# Patient Record
Sex: Female | Born: 2000 | Race: Black or African American | Hispanic: No | Marital: Married | Smoking: Never smoker
Health system: Southern US, Community
[De-identification: ages and names within clinical notes are randomized; demographics above are authoritative.]

## PROBLEM LIST (undated history)

## (undated) DIAGNOSIS — M419 Scoliosis, unspecified: Secondary | ICD-10-CM

## (undated) DIAGNOSIS — J45909 Unspecified asthma, uncomplicated: Secondary | ICD-10-CM

## (undated) HISTORY — PX: HERNIA REPAIR: SHX51

---
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
## 2022-11-01 DIAGNOSIS — Z0000 Encounter for general adult medical examination without abnormal findings: Secondary | ICD-10-CM | POA: Insufficient documentation

## 2022-11-01 DIAGNOSIS — Z8739 Personal history of other diseases of the musculoskeletal system and connective tissue: Secondary | ICD-10-CM | POA: Insufficient documentation

## 2022-11-01 DIAGNOSIS — J45909 Unspecified asthma, uncomplicated: Secondary | ICD-10-CM | POA: Insufficient documentation

## 2022-11-01 DIAGNOSIS — Z23 Encounter for immunization: Secondary | ICD-10-CM | POA: Insufficient documentation

## 2022-11-01 DIAGNOSIS — Z6827 Body mass index (BMI) 27.0-27.9, adult: Secondary | ICD-10-CM | POA: Insufficient documentation

## 2024-03-05 ENCOUNTER — Other Ambulatory Visit: Payer: Self-pay | Admitting: Medical Genetics

## 2024-03-12 NOTE — Patient Instructions (Signed)
 Preventive Care 25-25 Years Old, Female Preventive care refers to lifestyle choices and visits with your health care provider that can promote health and wellness. Preventive care visits are also called wellness exams. What can I expect for my preventive care visit? Counseling During your preventive care visit, your health care provider may ask about your: Medical history, including: Past medical problems. Family medical history. Pregnancy history. Current health, including: Menstrual cycle. Method of birth control. Emotional well-being. Home life and relationship well-being. Sexual activity and sexual health. Lifestyle, including: Alcohol, nicotine or tobacco, and drug use. Access to firearms. Diet, exercise, and sleep habits. Work and work Astronomer. Sunscreen use. Safety issues such as seatbelt and bike helmet use. Physical exam Your health care provider may check your: Height and weight. These may be used to calculate your BMI (body mass index). BMI is a measurement that tells if you are at a healthy weight. Waist circumference. This measures the distance around your waistline. This measurement also tells if you are at a healthy weight and may help predict your risk of certain diseases, such as type 2 diabetes and high blood pressure. Heart rate and blood pressure. Body temperature. Skin for abnormal spots. What immunizations do I need?  Vaccines are usually given at various ages, according to a schedule. Your health care provider will recommend vaccines for you based on your age, medical history, and lifestyle or other factors, such as travel or where you work. What tests do I need? Screening Your health care provider may recommend screening tests for certain conditions. This may include: Pelvic exam and Pap test. Lipid and cholesterol levels. Diabetes screening. This is done by checking your blood sugar (glucose) after you have not eaten for a while (fasting). Hepatitis  B test. Hepatitis C test. HIV (human immunodeficiency virus) test. STI (sexually transmitted infection) testing, if you are at risk. BRCA-related cancer screening. This may be done if you have a family history of breast, ovarian, tubal, or peritoneal cancers. Talk with your health care provider about your test results, treatment options, and if necessary, the need for more tests. Follow these instructions at home: Eating and drinking  Eat a healthy diet that includes fresh fruits and vegetables, whole grains, lean protein, and low-fat dairy products. Take vitamin and mineral supplements as recommended by your health care provider. Do not drink alcohol if: Your health care provider tells you not to drink. You are pregnant, may be pregnant, or are planning to become pregnant. If you drink alcohol: Limit how much you have to 0-1 drink a day. Know how much alcohol is in your drink. In the [place] one drink equals one 12 oz bottle of beer (355 mL), one 5 oz glass of wine (148 mL), or one 1 oz glass of hard liquor (44 mL). Lifestyle Brush your teeth every morning and night with fluoride toothpaste. Floss one time each day. Exercise for at least 30 minutes 5 or more days each week. Do not use any products that contain nicotine or tobacco. These products include cigarettes, chewing tobacco, and vaping devices, such as e-cigarettes. If you need help quitting, ask your health care provider. Do not use drugs. If you are sexually active, practice safe sex. Use a condom or other form of protection to prevent STIs. If you do not wish to become pregnant, use a form of birth control. If you plan to become pregnant, see your health care provider for a prepregnancy visit. Find healthy ways to manage stress, such as: Meditation,  yoga, or listening to music. Journaling. Talking to a trusted person. Spending time with friends and family. Minimize exposure to UV radiation to reduce your risk of skin  cancer. Safety Always wear your seat belt while driving or riding in a vehicle. Do not drive: If you have been drinking alcohol. Do not ride with someone who has been drinking. If you have been using any mind-altering substances or drugs. While texting. When you are tired or distracted. Wear a helmet and other protective equipment during sports activities. If you have firearms in your house, make sure you follow all gun safety procedures. Seek help if you have been physically or sexually abused. What's next? Go to your health care provider once a year for an annual wellness visit. Ask your health care provider how often you should have your eyes and teeth checked. Stay up to date on all vaccines. This information is not intended to replace advice given to you by your health care provider. Make sure you discuss any questions you have with your health care provider. Document Revised: 04/05/2021 Document Reviewed: 04/05/2021 Elsevier Patient Education  2024 [employer] Breast Self-Awareness Breast self-awareness is knowing how your breasts look and feel. You need to: Check your breasts on a regular basis. Tell your doctor about any changes. Become familiar with the look and feel of your breasts. This can help you catch a breast problem while it is still small and can be treated. You should do breast self-exams even if you have breast implants. What you need: A mirror. A well-lit room. A pillow or other soft object. How to do a breast self-exam Follow these steps to do a breast self-exam: Look for changes  Take off all the clothes above your waist. Stand in front of a mirror in a room with good lighting. Put your hands down at your sides. Compare your breasts in the mirror. Look for any difference between them, such as: A difference in shape. A difference in size. Wrinkles, dips, and bumps in one breast and not the other. Look at each breast for changes in the skin, such  as: Redness. Scaly areas. Skin that has gotten thicker. Dimpling. Open sores (ulcers). Look for changes in your nipples, such as: Fluid coming out of a nipple. Fluid around a nipple. Bleeding. Dimpling. Redness. A nipple that looks pushed in (retracted), or that has changed position. Feel for changes Lie on your back. Feel each breast. To do this: Pick a breast to feel. Place a pillow under the shoulder closest to that breast. Put the arm closest to that breast behind your head. Feel the nipple area of that breast using the hand of your other arm. Feel the area with the pads of your three middle fingers by making small circles with your fingers. Use light, medium, and firm pressure. Continue the overlapping circles, moving downward over the breast. Keep making circles with your fingers. Stop when you feel your ribs. Start making circles with your fingers again, this time going upward until you reach your collarbone. Then, make circles outward across your breast and into your armpit area. Squeeze your nipple. Check for discharge and lumps. Repeat these steps to check your other breast. Sit or stand in the tub or shower. With soapy water on your skin, feel each breast the same way you did when you were lying down. Write down what you find Writing down what you find can help you remember what to tell your doctor. Write down: What is  normal for each breast. Any changes you find in each breast. These include: The kind of changes you find. A tender or painful breast. Any lump you find. Write down its size and where it is. When you last had your monthly period (menstrual cycle). General tips If you are breastfeeding, the best time to check your breasts is after you feed your baby or after you use a breast pump. If you get monthly bleeding, the best time to check your breasts is 5-7 days after your monthly cycle ends. With time, you will become comfortable with the self-exam. You will  also start to know if there are changes in your breasts. Contact a doctor if: You see a change in the shape or size of your breasts or nipples. You see a change in the skin of your breast or nipples, such as red or scaly skin. You have fluid coming from your nipples that is not normal. You find a new lump or thick area. You have breast pain. You have any concerns about your breast health. Summary Breast self-awareness includes looking for changes in your breasts and feeling for changes within your breasts. You should do breast self-awareness in front of a mirror in a well-lit room. If you get monthly periods (menstrual cycles), the best time to check your breasts is 5-7 days after your period ends. Tell your doctor about any changes you see in your breasts. Changes include changes in size, changes on the skin, painful or tender breasts, or fluid from your nipples that is not normal. This information is not intended to replace advice given to you by your health care provider. Make sure you discuss any questions you have with your health care provider. Document Revised: 03/15/2022 Document Reviewed: 08/10/2021 Elsevier Patient Education  2024 [employer].

## 2024-03-13 ENCOUNTER — Encounter: Payer: Self-pay | Admitting: Certified Nurse Midwife

## 2024-03-13 ENCOUNTER — Other Ambulatory Visit: Payer: Self-pay

## 2024-03-13 ENCOUNTER — Ambulatory Visit: Payer: Self-pay | Admitting: Certified Nurse Midwife

## 2024-03-13 VITALS — BP 120/80 | HR 74 | Resp 16 | Ht 65.5 in | Wt 174.9 lb

## 2024-03-13 DIAGNOSIS — Z131 Encounter for screening for diabetes mellitus: Secondary | ICD-10-CM

## 2024-03-13 DIAGNOSIS — E282 Polycystic ovarian syndrome: Secondary | ICD-10-CM | POA: Insufficient documentation

## 2024-03-13 DIAGNOSIS — N83201 Unspecified ovarian cyst, right side: Secondary | ICD-10-CM

## 2024-03-13 DIAGNOSIS — Z1159 Encounter for screening for other viral diseases: Secondary | ICD-10-CM | POA: Diagnosis not present

## 2024-03-13 DIAGNOSIS — Z01419 Encounter for gynecological examination (general) (routine) without abnormal findings: Secondary | ICD-10-CM | POA: Insufficient documentation

## 2024-03-13 DIAGNOSIS — Z113 Encounter for screening for infections with a predominantly sexual mode of transmission: Secondary | ICD-10-CM

## 2024-03-13 DIAGNOSIS — Z114 Encounter for screening for human immunodeficiency virus [HIV]: Secondary | ICD-10-CM | POA: Diagnosis not present

## 2024-03-13 MED ORDER — METFORMIN HCL 500 MG PO TABS
ORAL_TABLET | ORAL | 5 refills | Status: DC
  Filled 2024-03-13: qty 60, 24d supply, fill #0
  Filled 2024-04-09: qty 60, 4d supply, fill #1

## 2024-03-13 NOTE — Progress Notes (Signed)
 GYNECOLOGY ANNUAL PHYSICAL EXAM PROGRESS NOTE  Subjective:    Jessica Gillespie is a 23 y.o. G0P0000 female who presents for an annual exam.  The patient is not sexually active. The patient participates in regular exercise: yes. Has the patient ever been transfused or tattooed?: no. The patient reports that there is not domestic violence in her life.   The patient has the following complaints today: Desires follow up on PCOS. Having issues with weight gain despite daily exercise and vegetarian diet. Previously diagnosed through imaging and symptoms.  Contraception counseling - desires replacement of Paragard IUD at next visit.   Menstrual History: Menarche age: 71 Patient's last menstrual period was 03/08/2024 (exact date). Period Cycle (Days): 33 Period Duration (Days): 5 Period Pattern: Regular Menstrual Flow: Light, Moderate Menstrual Control: Other (Comment) (cup or underwear) Menstrual Control Change Freq (Hours): 12 Dysmenorrhea: None   Gynecologic History:  Contraception: condoms History of STI's: Denies Last Pap: 11/01/2022. Results were: normal.  Denies h/o abnormal pap smears.   Upstream - 03/13/24 1445       Pregnancy Intention Screening   Does the patient want to become pregnant in the next year? No    Does the patient's partner want to become pregnant in the next year? No    Would the patient like to discuss contraceptive options today? Yes      Contraception Wrap Up   Current Method No Contraceptive Precautions    Contraception Counseling Provided Yes               OB History  Gravida Para Term Preterm AB Living  0 0 0 0 0 0  SAB IAB Ectopic Multiple Live Births  0 0 0 0 0    Past Medical History:  Diagnosis Date   Asthma    Scoliosis     Past Surgical History:  Procedure Laterality Date   HERNIA REPAIR     SPINAL FUSION  2015    Family History  Problem Relation Age of Onset   Hyperlipidemia Mother    Arrhythmia Mother    Obesity  Mother    Hypothyroidism Father     Social History   Socioeconomic History   Marital status: Married    Spouse name: Deanelle   Number of children: 0   Years of education: Not on file   Highest education level: Not on file  Occupational History   Not on file  Tobacco Use   Smoking status: Never   Smokeless tobacco: Never  Vaping Use   Vaping status: Never Used  Substance and Sexual Activity   Alcohol use: No   Drug use: No   Sexual activity: Not Currently  Other Topics Concern   Not on file  Social History Narrative   Not on file   Social Drivers of Health   Financial Resource Strain: Low Risk  (11/21/2023)   Received from [facility]   Overall Financial Resource Strain (CARDIA)    Difficulty of Paying Living Expenses: Not very hard  Food Insecurity: No Food Insecurity (11/21/2023)   Received from [facility]   Hunger Vital Sign    Worried About Running Out of Food in the Last Year: Never true    Ran Out of Food in the Last Year: Never true  Transportation Needs: No Transportation Needs (11/21/2023)   Received from [facility] - Transportation    In the past 12 months, has lack of transportation  kept you from medical appointments or from getting medications?: No    Lack of Transportation (Non-Medical): No  Physical Activity: Not on file  Stress: Not on file  Social Connections: Not on file  Intimate Partner Violence: Not At Risk (06/08/2022)   Received from [facility]    Threatened: Not on file    Insulted: Not on file    Physically Hurt : Not on file    Scream: Not on file    Current Outpatient Medications on File Prior to Visit  Medication Sig Dispense Refill   albuterol  (PROVENTIL  HFA;VENTOLIN  HFA) 108 (90 BASE) MCG/ACT inhaler Inhale 2 puffs into the lungs every 6 (six) hours as needed. For coughing     No current facility-administered medications on file prior to visit.     Allergies  Allergen Reactions   Other Other (See Comments)    Pt says she is allergic to an IV anesthetic but unaware of the name.  Reaction: combative and violent.  Pt reports it is versed.   Penicillins Hives   Rocephin [Ceftriaxone]      Review of Systems Constitutional: negative for chills, fatigue, fevers and sweats Eyes: negative for irritation, redness and visual disturbance Ears, nose, mouth, throat, and face: negative for hearing loss, nasal congestion, snoring and tinnitus Respiratory: negative for asthma, cough, sputum Cardiovascular: negative for chest pain, dyspnea, exertional chest pressure/discomfort, irregular heart beat, palpitations and syncope Gastrointestinal: negative for abdominal pain, change in bowel habits, nausea and vomiting Genitourinary: negative for abnormal menstrual periods, genital lesions, sexual problems and vaginal discharge, dysuria and urinary incontinence Integument/breast: negative for breast lump, breast tenderness and nipple discharge Hematologic/lymphatic: negative for bleeding and easy bruising Musculoskeletal:negative for back pain and muscle weakness Neurological: negative for dizziness, headaches, vertigo and weakness Endocrine: negative for diabetic symptoms including polydipsia, polyuria and skin dryness Allergic/Immunologic: negative for hay fever and urticaria      Objective:  Blood pressure 120/80, pulse 74, resp. rate 16, height 5' 5.5" (1.664 m), weight 174 lb 14.4 oz (79.3 kg), last menstrual period 03/08/2024. Body mass index is 28.66 kg/m.    General Appearance:    Alert, cooperative, no distress, appears stated age  Head:    Normocephalic, without obvious abnormality, atraumatic  Eyes:    PERRL, conjunctiva/corneas clear, EOM's intact, both eyes  Ears:    Normal external ear canals, both ears  Nose:   Nares normal, septum midline, mucosa normal, no drainage or sinus tenderness  Throat:   Lips, mucosa, and tongue  normal; teeth and gums normal  Neck:   Supple, symmetrical, trachea midline, no adenopathy; thyroid: no enlargement/tenderness/nodules; no carotid bruit or JVD  Back:     Symmetric, no curvature, ROM normal, no CVA tenderness  Lungs:     Clear to auscultation bilaterally, respirations unlabored  Chest Wall:    No tenderness or deformity   Heart:    Regular rate and rhythm, S1 and S2 normal, no murmur, rub or gallop  Breast Exam:    No tenderness, masses, or nipple abnormality  Abdomen:     Soft, non-tender, bowel sounds active all four quadrants, no masses, no organomegaly.    Genitalia:    Pelvic:external genitalia normal, vagina without lesions, discharge, or tenderness, rectovaginal septum  normal. Cervix normal in appearance, no cervical motion tenderness, no adnexal masses or tenderness.  Uterus normal size, shape, mobile, regular contours, nontender.  Rectal:    Normal external sphincter.  No hemorrhoids appreciated. Internal exam not done.  Extremities:   Extremities normal, atraumatic, no cyanosis or edema  Pulses:   2+ and symmetric all extremities  Skin:   Skin color, texture, turgor normal, no rashes or lesions  Lymph nodes:   Cervical, supraclavicular, and axillary nodes normal  Neurologic:   CNII-XII intact, normal strength, sensation and reflexes throughout   .  Assessment:   1. Screen for STD (sexually transmitted disease)   2. Encounter for screening for HIV   3. Need for hepatitis C screening test   4. Encounter for well woman exam with routine gynecological exam      Plan:  Blood tests: Basic metabolic panel, Comprehensive metabolic panel, Total cholesterol, and TSH. Contraception: condoms. Desires Paragard IUD Discussed healthy lifestyle modifications. Mammogram : Not age appropriate Pap smear discussed. Not due for 2 years. Last one normal Follow up in 1 year for annual exam  Will trial metformin for insulin resistance symptoms of PCOS. Pt desires US  to assess  ovaries for history of ovarian cyst rupture and PCOS.   Donato Fu, CNM [facility]

## 2024-03-14 LAB — COMPREHENSIVE METABOLIC PANEL WITH GFR
ALT: 11 IU/L (ref 0–32)
AST: 16 IU/L (ref 0–40)
Albumin: 4.6 g/dL (ref 4.0–5.0)
Alkaline Phosphatase: 68 IU/L (ref 44–121)
BUN/Creatinine Ratio: 7 — ABNORMAL LOW (ref 9–23)
BUN: 7 mg/dL (ref 6–20)
Bilirubin Total: <0.2 mg/dL (ref 0.0–1.2)
CO2: 23 mmol/L (ref 20–29)
Calcium: 9.6 mg/dL (ref 8.7–10.2)
Chloride: 104 mmol/L (ref 96–106)
Creatinine, Ser: 0.96 mg/dL (ref 0.57–1.00)
Globulin, Total: 2.9 g/dL (ref 1.5–4.5)
Glucose: 105 mg/dL — ABNORMAL HIGH (ref 70–99)
Potassium: 3.9 mmol/L (ref 3.5–5.2)
Sodium: 143 mmol/L (ref 134–144)
Total Protein: 7.5 g/dL (ref 6.0–8.5)
eGFR: 85 mL/min/{1.73_m2} (ref >59)

## 2024-03-14 LAB — CBC
Hematocrit: 41.6 % (ref 34.0–46.6)
Hemoglobin: 13.6 g/dL (ref 11.1–15.9)
MCH: 29.7 pg (ref 26.6–33.0)
MCHC: 32.7 g/dL (ref 31.5–35.7)
MCV: 91 fL (ref 79–97)
Platelets: 318 10*3/uL (ref 150–450)
RBC: 4.58 x10E6/uL (ref 3.77–5.28)
RDW: 12.2 % (ref 11.7–15.4)
WBC: 5.5 10*3/uL (ref 3.4–10.8)

## 2024-03-14 LAB — HEMOGLOBIN A1C
Est. average glucose Bld gHb Est-mCnc: 105 mg/dL
Hgb A1c MFr Bld: 5.3 % (ref 4.8–5.6)

## 2024-03-14 LAB — TSH: TSH: 0.841 u[IU]/mL (ref 0.450–4.500)

## 2024-03-14 LAB — HIV ANTIBODY (ROUTINE TESTING W REFLEX): HIV Screen 4th Generation wRfx: NONREACTIVE

## 2024-03-14 LAB — HEPATITIS C ANTIBODY: Hep C Virus Ab: NONREACTIVE

## 2024-03-27 ENCOUNTER — Ambulatory Visit
Admission: RE | Admit: 2024-03-27 | Discharge: 2024-03-27 | Disposition: A | Discharge status: Home / Self Care | Source: Ambulatory Visit | Attending: Certified Nurse Midwife | Admitting: Certified Nurse Midwife

## 2024-03-27 DIAGNOSIS — N83201 Unspecified ovarian cyst, right side: Secondary | ICD-10-CM | POA: Diagnosis not present

## 2024-03-27 DIAGNOSIS — Z01419 Encounter for gynecological examination (general) (routine) without abnormal findings: Secondary | ICD-10-CM | POA: Insufficient documentation

## 2024-03-27 DIAGNOSIS — N83202 Unspecified ovarian cyst, left side: Secondary | ICD-10-CM | POA: Insufficient documentation

## 2024-04-08 ENCOUNTER — Other Ambulatory Visit

## 2024-04-10 ENCOUNTER — Other Ambulatory Visit
Admission: RE | Admit: 2024-04-10 | Discharge: 2024-04-10 | Disposition: A | Discharge status: Home / Self Care | Payer: Self-pay | Source: Ambulatory Visit | Attending: Medical Genetics | Admitting: Medical Genetics

## 2024-04-14 ENCOUNTER — Other Ambulatory Visit: Payer: Self-pay | Admitting: Internal Medicine

## 2024-04-14 ENCOUNTER — Other Ambulatory Visit: Payer: Self-pay

## 2024-04-14 DIAGNOSIS — J4520 Mild intermittent asthma, uncomplicated: Secondary | ICD-10-CM

## 2024-04-14 MED ORDER — AIRSUPRA 90-80 MCG/ACT IN AERO
2.0000 | INHALATION_SPRAY | RESPIRATORY_TRACT | 10 refills | Status: DC | PRN
  Filled 2024-04-14: qty 10.7, 30d supply, fill #0

## 2024-04-14 NOTE — Progress Notes (Signed)
 Use as needed Avoid Allergens and Irritants Avoid secondhand smoke Avoid SICK contacts Recommend  Masking  when appropriate Recommend Keep up-to-date with vaccinations

## 2024-04-15 ENCOUNTER — Other Ambulatory Visit: Payer: Self-pay

## 2024-04-17 ENCOUNTER — Other Ambulatory Visit: Payer: Self-pay

## 2024-04-17 ENCOUNTER — Encounter: Payer: Self-pay | Admitting: Family Medicine

## 2024-04-17 ENCOUNTER — Ambulatory Visit: Admitting: Family Medicine

## 2024-04-17 VITALS — BP 110/56 | HR 64 | Ht 65.0 in | Wt 174.0 lb

## 2024-04-17 DIAGNOSIS — Z0000 Encounter for general adult medical examination without abnormal findings: Secondary | ICD-10-CM

## 2024-04-17 DIAGNOSIS — Z8739 Personal history of other diseases of the musculoskeletal system and connective tissue: Secondary | ICD-10-CM | POA: Diagnosis not present

## 2024-04-17 DIAGNOSIS — R5383 Other fatigue: Secondary | ICD-10-CM

## 2024-04-17 DIAGNOSIS — Z0001 Encounter for general adult medical examination with abnormal findings: Secondary | ICD-10-CM

## 2024-04-17 DIAGNOSIS — M545 Low back pain, unspecified: Secondary | ICD-10-CM

## 2024-04-17 DIAGNOSIS — Z23 Encounter for immunization: Secondary | ICD-10-CM | POA: Diagnosis not present

## 2024-04-17 DIAGNOSIS — E282 Polycystic ovarian syndrome: Secondary | ICD-10-CM | POA: Diagnosis not present

## 2024-04-17 DIAGNOSIS — Z131 Encounter for screening for diabetes mellitus: Secondary | ICD-10-CM | POA: Diagnosis not present

## 2024-04-17 DIAGNOSIS — Z13220 Encounter for screening for lipoid disorders: Secondary | ICD-10-CM

## 2024-04-17 DIAGNOSIS — Z130 Encounter for screening for diseases of the blood and blood-forming organs and certain disorders involving the immune mechanism: Secondary | ICD-10-CM

## 2024-04-17 MED ORDER — MELOXICAM 7.5 MG PO TABS
7.5000 mg | ORAL_TABLET | Freq: Every day | ORAL | 0 refills | Status: DC
  Filled 2024-04-17: qty 30, 30d supply, fill #0

## 2024-04-17 NOTE — Progress Notes (Signed)
 Complete physical exam   Patient: Jessica Gillespie   DOB: October 17, 2001   22 y.o. Female  MRN: [id number] Visit Date: 04/17/2024  Today's healthcare provider: Rockie Agent, MD   Chief Complaint  Patient presents with   Establish Care   Care Management    Pattern of eating: Vegetarian   Sleep:Normal   Are you exercising: Yes  What type of exercising:walking   How long:   How frequent: everyday   Vaccine: Agreed      Subjective    Jessica Gillespie is a 23 y.o. female who presents today for a complete physical exam.     She does have additional problems to discuss today.   Discussed the use of AI scribe software for clinical note transcription with the patient, who gave verbal consent to proceed.  History of Present Illness Jessica Gillespie is a 23 year old female who presents for an annual physical exam.  She experiences chronic fatigue despite obtaining 6-8 hours of sleep per night. She is concerned about her vitamin levels and has been attempting to spend more time outdoors. She uses coffee to manage her fatigue. She sometimes wakes up in the middle of the night due to noise or her cats and describes herself as a light sleeper. Her father has sleep apnea, but she has not been told she snores.  She has a history of polycystic ovary syndrome (PCOS) and previously tried metformin , which caused gastrointestinal issues, leading to its discontinuation. She manages her condition with diet and exercise. Her last A1c was 5.3, and she has normal thyroid  TSH and hemoglobin levels. There is a family history of elevated LDL cholesterol, and she manages her diet by limiting red meat and preferring chicken or fish.  She has been experiencing lower back pain for the past 3-4 weeks, radiating from her lower back to her right buttock and hip. The pain worsens with certain movements, such as turning in bed or getting up in the morning. She suspects it might be related to her  sleeping position or a muscle strain from horsing around with her husband. She has scoliosis and had spinal fusion surgery in the past. She uses Tiger Balm topically for pain relief and does not take oral anti-inflammatories.     Past Medical History:  Diagnosis Date   Asthma    Scoliosis    Past Surgical History:  Procedure Laterality Date   HERNIA REPAIR     SPINAL FUSION  2015   Social History   Socioeconomic History   Marital status: Married    Spouse name: Deanelle   Number of children: 0   Years of education: Not on file   Highest education level: Some college, no degree  Occupational History   Not on file  Tobacco Use   Smoking status: Never   Smokeless tobacco: Never  Vaping Use   Vaping status: Never Used  Substance and Sexual Activity   Alcohol use: No   Drug use: No   Sexual activity: Not Currently  Other Topics Concern   Not on file  Social History Narrative   Not on file   Social Drivers of Health   Financial Resource Strain: Low Risk  (04/17/2024)   Overall Financial Resource Strain (CARDIA)    Difficulty of Paying Living Expenses: Not very hard  Food Insecurity: No Food Insecurity (04/17/2024)   Hunger Vital Sign    Worried About Running Out of Food in the Last Year: Never  true    Ran Out of Food in the Last Year: Never true  Transportation Needs: No Transportation Needs (04/17/2024)   PRAPARE - Administrator, Civil Service (Medical): No    Lack of Transportation (Non-Medical): No  Physical Activity: Sufficiently Active (04/17/2024)   Exercise Vital Sign    Days of Exercise per Week: 4 days    Minutes of Exercise per Session: 60 min  Stress: No Stress Concern Present (04/17/2024)   [employer] of Occupational Health - Occupational Stress Questionnaire    Feeling of Stress: Only a little  Social Connections: Socially Integrated (04/17/2024)   Social Connection and Isolation Panel    Frequency of Communication with Friends and  Family: Three times a week    Frequency of Social Gatherings with Friends and Family: Once a week    Attends Religious Services: 1 to 4 times per year    Active Member of [employer] or Organizations: Yes    Attends Banker Meetings: 1 to 4 times per year    Marital Status: Married  Catering manager Violence: Not At Risk (04/17/2024)   Humiliation, Afraid, Rape, and Kick questionnaire    Fear of Current or Ex-Partner: No    Emotionally Abused: No    Physically Abused: No    Sexually Abused: No   Family Status  Relation Name Status   Mother  Alive   Father  Alive   MGM  Alive   MGF  Alive  No partnership data on file   Family History  Problem Relation Age of Onset   Hyperlipidemia Mother    Arrhythmia Mother    Obesity Mother    Hypothyroidism Father    Allergies  Allergen Reactions   Midazolam Other (See Comments)    Other reaction(s): OTHER  Other reaction(s): OTHER  Other reaction(s): OTHER  Other reaction(s): OTHER Other reaction(s): OTHER    Other reaction(s): OTHER Other reaction(s): OTHER Other reaction(s): OTHER    Other reaction(s): OTHER  Other reaction(s): OTHER  Other reaction(s): OTHER  Other reaction(s): OTHER   Other Other (See Comments)    Pt says she is allergic to an IV anesthetic but unaware of the name.  Reaction: combative and violent.  Pt reports it is versed.   Penicillins Hives   Rocephin [Ceftriaxone]      Medications: Outpatient Medications Prior to Visit  Medication Sig   Albuterol -Budesonide  (AIRSUPRA ) 90-80 MCG/ACT AERO Inhale 2 puffs into the lungs every 4 (four) hours as needed.   albuterol  (PROVENTIL  HFA;VENTOLIN  HFA) 108 (90 BASE) MCG/ACT inhaler Inhale 2 puffs into the lungs every 6 (six) hours as needed. For coughing   metFORMIN  (GLUCOPHAGE ) 500 MG tablet Take 1 tablet (500 mg total) by mouth daily for 7 days, THEN 1 tablet (500 mg total) 2 (two) times daily for 7 days, THEN 2 tablets (1,000 mg total) 2 (two) times daily  for 14 days. (Patient not taking: No sig reported)   No facility-administered medications prior to visit.    Review of Systems  Last CBC Lab Results  Component Value Date   WBC 5.5 03/13/2024   HGB 13.6 03/13/2024   HCT 41.6 03/13/2024   MCV 91 03/13/2024   MCH 29.7 03/13/2024   RDW 12.2 03/13/2024   PLT 318 03/13/2024   Last metabolic panel Lab Results  Component Value Date   GLUCOSE 105 (H) 03/13/2024   NA 143 03/13/2024   K 3.9 03/13/2024   CL 104 03/13/2024   CO2 23  03/13/2024   BUN 7 03/13/2024   CREATININE 0.96 03/13/2024   EGFR 85 03/13/2024   CALCIUM 9.6 03/13/2024   PROT 7.5 03/13/2024   ALBUMIN 4.6 03/13/2024   LABGLOB 2.9 03/13/2024   BILITOT <0.2 03/13/2024   ALKPHOS 68 03/13/2024   AST 16 03/13/2024   ALT 11 03/13/2024   Last lipids No results found for: CHOL, HDL, LDLCALC, LDLDIRECT, TRIG, CHOLHDL Last hemoglobin A1c Lab Results  Component Value Date   HGBA1C 5.3 03/13/2024   Last thyroid  functions Lab Results  Component Value Date   TSH 0.841 03/13/2024   Last vitamin D No results found for: 25OHVITD2, 25OHVITD3, VD25OH Last vitamin B12 and Folate No results found for: VITAMINB12, FOLATE     Objective    BP (!) 110/56   Pulse 64   Ht 5' 5 (1.651 m)   Wt 174 lb (78.9 kg)   SpO2 100%   BMI 28.96 kg/m  BP Readings from Last 3 Encounters:  04/17/24 (!) 110/56  03/13/24 120/80  07/13/16 107/66   Wt Readings from Last 3 Encounters:  04/17/24 174 lb (78.9 kg)  03/13/24 174 lb 14.4 oz (79.3 kg)  06/22/14 118 lb 8 oz (53.8 kg) (58%, Z= 0.19)*   * Growth percentiles are based on CDC (Girls, 2-20 Years) data.        Physical Exam Vitals reviewed.  Constitutional:      General: She is not in acute distress.    Appearance: Normal appearance. She is not ill-appearing.   Cardiovascular:     Rate and Rhythm: Normal rate and regular rhythm.  Pulmonary:     Effort: Pulmonary effort is normal. No respiratory  distress.     Breath sounds: No wheezing, rhonchi or rales.   Musculoskeletal:     Comments: Lumbar, midline surgical scar    Neurological:     Mental Status: She is alert and oriented to person, place, and time.   Psychiatric:        Mood and Affect: Mood normal.        Behavior: Behavior normal.     Physical Exam MUSCULOSKELETAL: Pain in right hip on extension. No pain on forward flexion of spine. Pain in waistline on spinal extension. Negative straight leg raise bilaterally.      Last depression screening scores    04/17/2024    3:08 PM 03/13/2024    2:46 PM  PHQ 2/9 Scores  PHQ - 2 Score 1 0  PHQ- 9 Score 10     Last fall risk screening     No data to display          Last Audit-C alcohol use screening    04/17/2024    3:01 PM  Alcohol Use Disorder Test (AUDIT)  1. How often do you have a drink containing alcohol? 0  2. How many drinks containing alcohol do you have on a typical day when you are drinking? 0  3. How often do you have six or more drinks on one occasion? 0  AUDIT-C Score 0   A score of 3 or more in women, and 4 or more in men indicates increased risk for alcohol abuse, EXCEPT if all of the points are from question 1   No results found for any visits on 04/17/24.  Assessment & Plan    Routine Health Maintenance and Physical Exam  Immunization History  Administered Date(s) Administered   DTaP 10/18/1999, 12/21/1999, 02/22/2000, 10/08/2000   Dtap, Unspecified 12/30/2003   Fluzone  Influenza virus vaccine,trivalent (IIV3), split virus 07/30/2011, 12/28/2019   HIB (PRP-OMP) 10/08/2000   HIB, Unspecified 10/18/1999, 12/21/1999, 02/22/2000   HPV 9-valent 11/01/2022, 11/29/2022, 04/17/2024   Hep A, Unspecified 08/14/2007   Hep B, Unspecified 08/27/1999, 01/22/2000, 04/29/2000   Hepatitis A, Ped/Adol-2 Dose 11/24/2009   Hepb-cpg 04/17/2024   IPV 10/18/1999, 12/21/1999, 02/22/2000   Influenza Inj Mdck Quad Pf 12/19/2018   Influenza, Seasonal,  Injecte, Preservative Fre 08/13/2008, 11/28/2010   Influenza,inj,Quad PF,6+ Mos 08/21/2012, 12/28/2019, 08/18/2022   Influenza,inj,quad, With Preservative 12/19/2018   Influenza-Unspecified 09/08/2009, 08/18/2022, 07/11/2023   MMR 10/08/2000, 12/30/2003   Meningococcal Conjugate 11/28/2010   Moderna Covid-19 Fall Seasonal Vaccine 6yrs & older 08/18/2022   Novel Infuenza-h1n1-09 08/13/2008   PFIZER(Purple Top)SARS-COV-2 Vaccination 01/07/2020, 02/01/2020, 10/17/2020   PNEUMOCOCCAL CONJUGATE-20 04/17/2024   PPD Test 12/19/2018   Polio, Unspecified 12/30/2003   Td 11/24/2009   Tdap 11/24/2009, 10/05/2022   Varicella 10/08/2000, 08/14/2007    Health Maintenance  Topic Date Due   Cervical Cancer Screening (Pap smear)  Never done   COVID-19 Vaccine (5 - 2024-25 season) 06/23/2023   Hepatitis B Vaccines (2 of 2 - CpG 2-dose series) 05/15/2024   INFLUENZA VACCINE  05/22/2024   DTaP/Tdap/Td (9 - Td or Tdap) 10/05/2032   Pneumococcal Vaccine 4-60 Years old  Completed   HPV VACCINES  Completed   Hepatitis C Screening  Completed   HIV Screening  Completed   Meningococcal B Vaccine  Aged Out    Problem List Items Addressed This Visit       Endocrine   PCOS (polycystic ovarian syndrome)     Other   Hx of scoliosis   Relevant Orders   AMB referral to orthopedics   Annual physical exam - Primary   Other Visit Diagnoses       Need for HPV vaccine       Relevant Orders   HPV 9-valent vaccine,Recombinat (Completed)     Need for hepatitis vaccination       Relevant Orders   Heplisav-B (HepB-CPG) Vaccine (Completed)     Screening for deficiency anemia         Screening for diabetes mellitus       Relevant Orders   Lipid panel     Screening for lipid disorders         Feeling tired       Relevant Orders   VITAMIN D 25 Hydroxy (Vit-D Deficiency, Fractures)   Vitamin B12   Home sleep test   Ambulatory referral to Sleep Studies     Need for pneumococcal vaccination        Relevant Orders   Pneumococcal conjugate vaccine 20-valent (Prevnar 20) (Completed)     Acute right-sided low back pain without sciatica       Relevant Medications   meloxicam (MOBIC) 7.5 MG tablet   Other Relevant Orders   AMB referral to orthopedics       Assessment & Plan Fatigue Chronic fatigue despite adequate sleep, possibly due to vitamin deficiency or stress. Reports waking up tired, staying tired throughout the day, and occasional nighttime awakenings due to external disturbances. Family history of sleep apnea. - Order vitamin D and B12 levels - Order home sleep study - Refer to Dr. Isaiah for sleep study follow-up  Lower Back Pain Intermittent lower back pain radiating to the right buttock and hip, likely due to muscle strain or piriformis syndrome. Negative for sciatica as pain does not radiate down the leg. Exacerbated by certain movements  and positions. - Prescribe meloxicam 7.5 mg once daily - Provide piriformis exercises - Advise against additional NSAIDs  Polycystic Ovary Syndrome (PCOS) PCOS managed with diet and exercise. Previous trial of metformin  not tolerated due to gastrointestinal side effects. Prefers lifestyle modifications.  Hyperlipidemia Elevated LDL consistent with family history. Follows a diet low in red meat and high in chicken and fish. - Order lipid panel  General Health Maintenance Annual physical examination. Administer HPV, hepatitis B, and pneumococcal vaccines. - Administered HPV, hepatitis B, and pneumococcal vaccines - Order and lipid panel  Follow-up Plans for follow-up and referrals. Prefers to transition orthopedic care closer to current location. - Refer to Dr. Isaiah for sleep study follow-up       No follow-ups on file.       Rockie Agent, MD  [facility] [phone] (phone) [phone] (fax)  [facility]

## 2024-04-18 LAB — VITAMIN B12: Vitamin B-12: 536 pg/mL (ref 232–1245)

## 2024-04-18 LAB — LIPID PANEL
Chol/HDL Ratio: 3.7 ratio (ref 0.0–4.4)
Cholesterol, Total: 190 mg/dL (ref 100–199)
HDL: 52 mg/dL (ref >39)
LDL Chol Calc (NIH): 119 mg/dL — ABNORMAL HIGH (ref 0–99)
Triglycerides: 105 mg/dL (ref 0–149)
VLDL Cholesterol Cal: 19 mg/dL (ref 5–40)

## 2024-04-18 LAB — VITAMIN D 25 HYDROXY (VIT D DEFICIENCY, FRACTURES): Vit D, 25-Hydroxy: 20.5 ng/mL — ABNORMAL LOW (ref 30.0–100.0)

## 2024-04-20 ENCOUNTER — Other Ambulatory Visit: Payer: Self-pay | Admitting: Internal Medicine

## 2024-04-20 ENCOUNTER — Ambulatory Visit: Payer: Self-pay | Admitting: Family Medicine

## 2024-04-20 DIAGNOSIS — J4520 Mild intermittent asthma, uncomplicated: Secondary | ICD-10-CM

## 2024-04-20 NOTE — Progress Notes (Signed)
 Patient to be seen Aug 26th needs, labs ordered

## 2024-04-24 LAB — GENECONNECT MOLECULAR SCREEN: Genetic Analysis Overall Interpretation: NEGATIVE

## 2024-04-27 ENCOUNTER — Encounter: Payer: Self-pay | Admitting: Family Medicine

## 2024-04-27 ENCOUNTER — Ambulatory Visit: Attending: Family Medicine

## 2024-04-27 ENCOUNTER — Telehealth (INDEPENDENT_AMBULATORY_CARE_PROVIDER_SITE_OTHER): Admitting: Family Medicine

## 2024-04-27 ENCOUNTER — Ambulatory Visit: Admitting: Family Medicine

## 2024-04-27 DIAGNOSIS — R0789 Other chest pain: Secondary | ICD-10-CM | POA: Diagnosis not present

## 2024-04-27 DIAGNOSIS — R002 Palpitations: Secondary | ICD-10-CM

## 2024-04-27 NOTE — Progress Notes (Signed)
 MyChart Video Visit    Virtual Visit via Video Note   This format is felt to be most appropriate for this patient at this time. Physical exam was limited by quality of the video and audio technology used for the visit.   Patient location: Patient's home address   Provider location: [facility]  [address]  [place], [place] [address]   I discussed the limitations of evaluation and management by telemedicine and the availability of in person appointments. The patient expressed understanding and agreed to proceed.  Patient: Jessica Gillespie   DOB: June 29, 2001   23 y.o. Female  MRN: [id number] Visit Date: 04/27/2024  Today's healthcare provider: Rockie Agent, MD   Chief Complaint  Patient presents with   Palpitations   Chest Pain   Subjective    HPI   Discussed the use of AI scribe software for clinical note transcription with the patient, who gave verbal consent to proceed.  History of Present Illness Jessica Gillespie is a 23 year old female who presents with concerns for a fluttering sensation of her heart.  She experiences an intermittent fluttering sensation in her heart, often associated with increased stress. Episodes of chest tightness and an elevated heart rate, reaching into the 120s and sometimes the 110s, occur even while resting or lying in bed. She has had some remote episodes of chest pain. No lightheadedness or dizziness accompanies these symptoms. Her symptoms occur independently of her albuterol  inhaler use for asthma.  Her current medications include Airsupra , albuterol , and meloxicam . She is not currently taking metformin .  She is unsure of any cardiac history in her family due to being estranged from her mother but reports a likely cardiovascular history on her paternal side.      Past Medical History:  Diagnosis Date   Asthma    Scoliosis     Medications: Outpatient Medications Prior to Visit  Medication  Sig   Albuterol -Budesonide  (AIRSUPRA ) 90-80 MCG/ACT AERO Inhale 2 puffs into the lungs every 4 (four) hours as needed.   albuterol  (PROVENTIL  HFA;VENTOLIN  HFA) 108 (90 BASE) MCG/ACT inhaler Inhale 2 puffs into the lungs every 6 (six) hours as needed. For coughing   meloxicam  (MOBIC ) 7.5 MG tablet Take 1 tablet (7.5 mg total) by mouth daily.   metFORMIN  (GLUCOPHAGE ) 500 MG tablet Take 1 tablet (500 mg total) by mouth daily for 7 days, THEN 1 tablet (500 mg total) 2 (two) times daily for 7 days, THEN 2 tablets (1,000 mg total) 2 (two) times daily for 14 days. (Patient not taking: No sig reported)   No facility-administered medications prior to visit.    Review of Systems  Last CBC Lab Results  Component Value Date   WBC 5.5 03/13/2024   HGB 13.6 03/13/2024   HCT 41.6 03/13/2024   MCV 91 03/13/2024   MCH 29.7 03/13/2024   RDW 12.2 03/13/2024   PLT 318 03/13/2024   Last metabolic panel Lab Results  Component Value Date   GLUCOSE 105 (H) 03/13/2024   NA 143 03/13/2024   K 3.9 03/13/2024   CL 104 03/13/2024   CO2 23 03/13/2024   BUN 7 03/13/2024   CREATININE 0.96 03/13/2024   EGFR 85 03/13/2024   CALCIUM 9.6 03/13/2024   PROT 7.5 03/13/2024   ALBUMIN 4.6 03/13/2024   LABGLOB 2.9 03/13/2024   BILITOT <0.2 03/13/2024   ALKPHOS 68 03/13/2024   AST 16 03/13/2024   ALT 11 03/13/2024   Last lipids Lab  Results  Component Value Date   CHOL 190 04/17/2024   HDL 52 04/17/2024   LDLCALC 119 (H) 04/17/2024   TRIG 105 04/17/2024   CHOLHDL 3.7 04/17/2024   Last hemoglobin A1c Lab Results  Component Value Date   HGBA1C 5.3 03/13/2024   Last thyroid  functions Lab Results  Component Value Date   TSH 0.841 03/13/2024   Last vitamin D  Lab Results  Component Value Date   VD25OH 20.5 (L) 04/17/2024   Last vitamin B12 and Folate Lab Results  Component Value Date   VITAMINB12 536 04/17/2024        Objective    There were no vitals taken for this visit.  BP Readings  from Last 3 Encounters:  04/17/24 (!) 110/56  03/13/24 120/80  07/13/16 107/66   Wt Readings from Last 3 Encounters:  04/17/24 174 lb (78.9 kg)  03/13/24 174 lb 14.4 oz (79.3 kg)  06/22/14 118 lb 8 oz (53.8 kg) (58%, Z= 0.19)*   * Growth percentiles are based on CDC (Girls, 2-20 Years) data.        Physical Exam Vitals reviewed.  Constitutional:      General: She is not in acute distress.    Appearance: Normal appearance. She is not ill-appearing.  Pulmonary:     Effort: Pulmonary effort is normal. No respiratory distress.  Neurological:     Mental Status: She is alert and oriented to person, place, and time.  Psychiatric:        Mood and Affect: Mood normal.        Behavior: Behavior normal.        Thought Content: Thought content normal.        Assessment & Plan     Problem List Items Addressed This Visit   None Visit Diagnoses       Heart palpitations    -  Primary   Relevant Orders   LONG TERM MONITOR XT (3-14 DAYS)   Ambulatory referral to Cardiology     Sensation of chest tightness       Relevant Orders   LONG TERM MONITOR XT (3-14 DAYS)   Ambulatory referral to Cardiology        Assessment & Plan    Palpitations Intermittent palpitations with associated chest tightness and elevated heart rate, sometimes reaching 120 bpm. Symptoms occur at rest and are sometimes associated with stress. No associated lightheadedness or dizziness. Differential diagnosis includes atrial fibrillation, given difficulty discerning P waves on ECG tracing. Family history of cardiovascular issues on paternal side. She is concerned about atrial fibrillation. - Order Zio patch heart monitor for 14 days - Refer to cardiology for further evaluation  Asthma Asthma managed with Airsupra  and albuterol  inhalers. Symptoms of palpitations are independent of albuterol  use.  Type 2 Diabetes Mellitus Type 2 diabetes mellitus, currently not taking metformin .      No follow-ups  on file.     I discussed the assessment and treatment plan with the patient. The patient was provided an opportunity to ask questions and all were answered. The patient agreed with the plan and demonstrated an understanding of the instructions.   The patient was advised to call back or seek an in-person evaluation if the symptoms worsen or if the condition fails to improve as anticipated.  I provided 25 minutes of non-face-to-face time during this encounter.   Rockie Agent, MD [facility] [phone] (phone) [phone] (fax)  [facility]

## 2024-05-06 NOTE — Progress Notes (Unsigned)
     GYNECOLOGY OFFICE PROCEDURE NOTE  Jessica Gillespie is a 23 y.o. G0P0000 here for Paragard IUD insertion. No GYN concerns.  Last pap smear was on 11/01/2022 and was normal.  IUD Insertion Procedure Note Patient identified, informed consent performed, consent signed.   Discussed risks of irregular bleeding, cramping, infection, malpositioning or misplacement of the IUD outside the uterus which may require further procedure such as laparoscopy. Also discussed >99% contraception efficacy, increased risk of ectopic pregnancy with failure of method.   Emphasized that this did not protect against STIs, condoms recommended during all sexual encounters. Time out was performed.  Urine pregnancy test negative.  Speculum placed in the vagina.  Cervix visualized.  Cleaned with Betadine x 2.  Grasped anteriorly with a single tooth tenaculum.  Uterus sounded to *** cm.  *** IUD placed per manufacturer's recommendations.  Strings trimmed to 3 cm. Tenaculum was removed, good hemostasis noted.  Patient tolerated procedure well.   Patient was given post-procedure instructions.  She was advised to have backup contraception for one week.  Patient was also asked to check IUD strings periodically and follow up in 4 weeks for IUD check.    Vicci Rollo BRAVO, RN [facility]

## 2024-05-08 ENCOUNTER — Encounter: Payer: Self-pay | Admitting: Certified Nurse Midwife

## 2024-05-08 ENCOUNTER — Ambulatory Visit: Admitting: Certified Nurse Midwife

## 2024-05-08 VITALS — BP 118/75 | HR 66 | Ht 65.5 in | Wt 172.3 lb

## 2024-05-08 DIAGNOSIS — Z3202 Encounter for pregnancy test, result negative: Secondary | ICD-10-CM | POA: Diagnosis not present

## 2024-05-08 DIAGNOSIS — Z30430 Encounter for insertion of intrauterine contraceptive device: Secondary | ICD-10-CM | POA: Diagnosis not present

## 2024-05-08 DIAGNOSIS — Z01812 Encounter for preprocedural laboratory examination: Secondary | ICD-10-CM

## 2024-05-08 LAB — POCT URINE PREGNANCY: Preg Test, Ur: NEGATIVE

## 2024-05-08 MED ORDER — PARAGARD INTRAUTERINE COPPER IU IUD
1.0000 | INTRAUTERINE_SYSTEM | Freq: Once | INTRAUTERINE | Status: AC
  Administered 2024-05-08: 1 via INTRAUTERINE

## 2024-05-18 DIAGNOSIS — R002 Palpitations: Secondary | ICD-10-CM | POA: Diagnosis not present

## 2024-05-18 DIAGNOSIS — R0789 Other chest pain: Secondary | ICD-10-CM | POA: Diagnosis not present

## 2024-05-20 DIAGNOSIS — R0789 Other chest pain: Secondary | ICD-10-CM | POA: Diagnosis not present

## 2024-05-20 DIAGNOSIS — R002 Palpitations: Secondary | ICD-10-CM

## 2024-05-22 ENCOUNTER — Ambulatory Visit: Payer: Self-pay | Admitting: Family Medicine

## 2024-05-28 ENCOUNTER — Encounter

## 2024-06-09 ENCOUNTER — Other Ambulatory Visit
Admission: RE | Admit: 2024-06-09 | Discharge: 2024-06-09 | Disposition: A | Discharge status: Home / Self Care | Source: Ambulatory Visit | Attending: Internal Medicine | Admitting: Internal Medicine

## 2024-06-12 ENCOUNTER — Other Ambulatory Visit
Admission: RE | Admit: 2024-06-12 | Discharge: 2024-06-12 | Disposition: A | Discharge status: Home / Self Care | Source: Ambulatory Visit | Attending: Internal Medicine | Admitting: Internal Medicine

## 2024-06-12 ENCOUNTER — Other Ambulatory Visit: Payer: Self-pay | Admitting: Internal Medicine

## 2024-06-12 DIAGNOSIS — J4520 Mild intermittent asthma, uncomplicated: Secondary | ICD-10-CM

## 2024-06-12 DIAGNOSIS — R069 Unspecified abnormalities of breathing: Secondary | ICD-10-CM | POA: Diagnosis not present

## 2024-06-12 LAB — CBC WITH DIFFERENTIAL/PLATELET
Abs Immature Granulocytes: 0.02 K/uL (ref 0.00–0.07)
Basophils Absolute: 0 K/uL (ref 0.0–0.1)
Basophils Relative: 1 %
Eosinophils Absolute: 0.4 K/uL (ref 0.0–0.5)
Eosinophils Relative: 6 %
HCT: 39.3 % (ref 36.0–46.0)
Hemoglobin: 13.2 g/dL (ref 12.0–15.0)
Immature Granulocytes: 0 %
Lymphocytes Relative: 28 %
Lymphs Abs: 1.8 K/uL (ref 0.7–4.0)
MCH: 30.2 pg (ref 26.0–34.0)
MCHC: 33.6 g/dL (ref 30.0–36.0)
MCV: 89.9 fL (ref 80.0–100.0)
Monocytes Absolute: 0.6 K/uL (ref 0.1–1.0)
Monocytes Relative: 10 %
Neutro Abs: 3.6 K/uL (ref 1.7–7.7)
Neutrophils Relative %: 55 %
Platelets: 300 K/uL (ref 150–400)
RBC: 4.37 MIL/uL (ref 3.87–5.11)
RDW: 12.1 % (ref 11.5–15.5)
WBC: 6.5 K/uL (ref 4.0–10.5)
nRBC: 0 % (ref 0.0–0.2)

## 2024-06-12 NOTE — Progress Notes (Signed)
 Allergen Panel 27 ordered

## 2024-06-12 NOTE — Addendum Note (Signed)
 Addended by: Tilia Faso on: 06/12/2024 11:41 AM   Modules accepted: Orders

## 2024-06-14 LAB — ALLERGEN PANEL (27) + IGE
Alternaria Alternata IgE: <0.1 kU/L
Aspergillus Fumigatus IgE: <0.1 kU/L
Bahia Grass IgE: <0.1 kU/L
Bermuda Grass IgE: <0.1 kU/L
Cat Dander IgE: 0.17 kU/L — AB
Cedar, Mountain IgE: <0.1 kU/L
Cladosporium Herbarum IgE: <0.1 kU/L
Cocklebur IgE: <0.1 kU/L
Cockroach, American IgE: <0.1 kU/L
Common Silver Birch IgE: <0.1 kU/L
D Farinae IgE: <0.1 kU/L
D Pteronyssinus IgE: <0.1 kU/L
Dog Dander IgE: <0.1 kU/L
Elm, American IgE: <0.1 kU/L
Hickory, White IgE: <0.1 kU/L
IgE (Immunoglobulin E), Serum: 34 [IU]/mL (ref 6–495)
Johnson Grass IgE: <0.1 kU/L
Kentucky Bluegrass IgE: <0.1 kU/L
Maple/Box Elder IgE: <0.1 kU/L
Mucor Racemosus IgE: <0.1 kU/L
Oak, White IgE: <0.1 kU/L
Penicillium Chrysogen IgE: <0.1 kU/L
Pigweed, Rough IgE: <0.1 kU/L
Plantain, English IgE: <0.1 kU/L
Ragweed, Short IgE: <0.1 kU/L
Setomelanomma Rostrat: <0.1 kU/L
Timothy Grass IgE: <0.1 kU/L
White Mulberry IgE: <0.1 kU/L

## 2024-06-16 ENCOUNTER — Ambulatory Visit: Admitting: Internal Medicine

## 2024-06-16 ENCOUNTER — Other Ambulatory Visit: Payer: Self-pay

## 2024-06-16 ENCOUNTER — Encounter: Admitting: Internal Medicine

## 2024-06-16 ENCOUNTER — Encounter: Payer: Self-pay | Admitting: Family Medicine

## 2024-06-16 ENCOUNTER — Encounter: Payer: Self-pay | Admitting: Internal Medicine

## 2024-06-16 VITALS — HR 91 | Ht 65.0 in | Wt 164.0 lb

## 2024-06-16 DIAGNOSIS — E559 Vitamin D deficiency, unspecified: Secondary | ICD-10-CM | POA: Diagnosis not present

## 2024-06-16 DIAGNOSIS — R011 Cardiac murmur, unspecified: Secondary | ICD-10-CM

## 2024-06-16 DIAGNOSIS — J4520 Mild intermittent asthma, uncomplicated: Secondary | ICD-10-CM | POA: Diagnosis not present

## 2024-06-16 DIAGNOSIS — G4719 Other hypersomnia: Secondary | ICD-10-CM

## 2024-06-16 LAB — MISC LABCORP TEST (SEND OUT): Labcorp test code: 600531

## 2024-06-16 LAB — PULMONARY FUNCTION TEST
DL/VA % pred: 103 %
DL/VA: 4.81 ml/min/mmHg/L
DLCO unc % pred: 95 %
DLCO unc: 22.31 ml/min/mmHg
FEF 25-75 Post: 2.59 L/s
FEF 25-75 Pre: 2.14 L/s
FEF2575-%Change-Post: 21 %
FEF2575-%Pred-Post: 70 %
FEF2575-%Pred-Pre: 57 %
FEV1-%Change-Post: 6 %
FEV1-%Pred-Post: 84 %
FEV1-%Pred-Pre: 78 %
FEV1-Post: 2.83 L
FEV1-Pre: 2.66 L
FEV1FVC-%Change-Post: 5 %
FEV1FVC-%Pred-Pre: 87 %
FEV6-%Change-Post: 2 %
FEV6-%Pred-Post: 91 %
FEV6-%Pred-Pre: 90 %
FEV6-Post: 3.58 L
FEV6-Pre: 3.51 L
FEV6FVC-%Change-Post: 0 %
FEV6FVC-%Pred-Post: 100 %
FEV6FVC-%Pred-Pre: 100 %
FVC-%Change-Post: 1 %
FVC-%Pred-Post: 91 %
FVC-%Pred-Pre: 90 %
FVC-Post: 3.58 L
FVC-Pre: 3.53 L
Post FEV1/FVC ratio: 79 %
Post FEV6/FVC ratio: 100 %
Pre FEV1/FVC ratio: 75 %
Pre FEV6/FVC Ratio: 100 %
RV % pred: 131 %
RV: 1.73 L
TLC % pred: 100 %
TLC: 5.22 L

## 2024-06-16 MED ORDER — ALBUTEROL SULFATE HFA 108 (90 BASE) MCG/ACT IN AERS
2.0000 | INHALATION_SPRAY | RESPIRATORY_TRACT | 10 refills | Status: AC | PRN
  Filled 2024-06-16: qty 6.7, 28d supply, fill #0

## 2024-06-16 NOTE — Progress Notes (Unsigned)
 [facility] Pulmonary Medicine Consultation      Date: 06/16/2024,   MRN# [id number] Jessica Gillespie 2001/04/26    CHIEF COMPLAINT:   Assessment of asthma Assessment of OSA   HISTORY OF PRESENT ILLNESS   23 year old pleasant female seen today for assessment of asthma and assessment for sleep apnea  Assessment of ASTHMA IgE to cat dander 0.17 which is equivalent All other allergy panel tests unremarkable Eosinophil absolute levels 400 IgE level 34  Patient exposed to cats at home for approximately 1 year Patient not on any maintenance therapy for asthma Pretty much well-controlled with avoidance of allergens Patient uses Airsupra  as needed, however patient does complain of chest pain and tightness when she uses it Plan to switch who albuterol  as needed only  No exacerbation at this time No evidence of heart failure at this time No evidence or signs of infection at this time No respiratory distress No fevers, chills, nausea, vomiting, diarrhea No evidence of lower extremity edema No evidence hemoptysis   Assessment of ASTHMA FeNO   8  ppb-Elevated exhaled Nitric oxide testing is NOT consistent with type II inflammation  Pulmonary function testing 06/16/2024 Reviewed with patient in detail Postbronchodilator FEV1 FVC ratio 79% predicted FEV1 is 84% predicted FVC is 91% predicted No significant bronchodilator response FEF 25/75 did show 21% bronchodilator change TLC 100% predicted RV 131% predicted RV/TLC ratio is 136% predicted DLCO is 95% predicted Flow volume loops expiratory limb that show pattern of probable underlying obstructive disease Final interpretation findings suggest small obstructive airways disease likely asthma with underlying hyperinflation and air trapping Clinical correlation advised   H/o Allergic rhinitis but not active at this time No history of food allergies  Assessment Of OSA Patient is seen today for problems and issues with sleep  related to excessive daytime sleepiness Patient  has been having sleep problems for many years Patient has been having excessive daytime sleepiness for a long time Patient has been having extreme fatigue and tiredness, lack of energy +  very Loud snoring every night  Discussed risk of untreated sleep apnea including cardiac arrhthymias, stroke, DM, pulm HTN.    EPWORTH Score 9  Family history of sleep apnea Wakes up 1-2 times per night States that she is a light sleeper Excessive daytime sleepiness and fatigue throughout the day Home sleep test reviewed with patient in detail showing mild levels of OSA anywhere between 1-5-5 with significant snoring episodes Discussed several treatment options including possible starting CPAP therapy Patient has severe vitamin D  deficiency and at this time plan is to replace aggressively and reassess for daytime fatigue and tiredness in 3 months   PAST MEDICAL HISTORY   Past Medical History:  Diagnosis Date   Asthma    Scoliosis      SURGICAL HISTORY   Past Surgical History:  Procedure Laterality Date   HERNIA REPAIR     SPINAL FUSION  2015     FAMILY HISTORY   Family History  Problem Relation Age of Onset   Hyperlipidemia Mother    Arrhythmia Mother    Obesity Mother    Hypothyroidism Father      SOCIAL HISTORY   Social History   Tobacco Use   Smoking status: Never   Smokeless tobacco: Never  Vaping Use   Vaping status: Never Used  Substance Use Topics   Alcohol use: No   Drug use: No     MEDICATIONS    Home Medication:  Current Outpatient Rx  Order #: [id number] Class: Historical Med   Order #: [id number] Class: Normal   Order #: [id number] Class: Normal   Order #: [id number] Class: Normal    Current Medication:  Current Outpatient Medications:    albuterol  (PROVENTIL  HFA;VENTOLIN  HFA) 108 (90 BASE) MCG/ACT inhaler, Inhale 2 puffs into the lungs every 6 (six) hours as needed. For coughing, Disp: , Rfl:     Albuterol -Budesonide  (AIRSUPRA ) 90-80 MCG/ACT AERO, Inhale 2 puffs into the lungs every 4 (four) hours as needed., Disp: 10.7 g, Rfl: 10   meloxicam  (MOBIC ) 7.5 MG tablet, Take 1 tablet (7.5 mg total) by mouth daily., Disp: 30 tablet, Rfl: 0   metFORMIN  (GLUCOPHAGE ) 500 MG tablet, Take 1 tablet (500 mg total) by mouth daily for 7 days, THEN 1 tablet (500 mg total) 2 (two) times daily for 7 days, THEN 2 tablets (1,000 mg total) 2 (two) times daily for 14 days. (Patient not taking: No sig reported), Disp: 60 tablet, Rfl: 5    ALLERGIES   Midazolam, Other, Penicillins, and Rocephin [ceftriaxone]  120/58  HR 116  RR 18 99% o2 sats on RA, T 99.6  Review of Systems: Gen:  Denies  fever, sweats, chills weight loss  HEENT: Denies blurred vision, double vision, ear pain, eye pain, hearing loss, nose bleeds, sore throat Cardiac:  No dizziness, chest pain or heaviness, chest tightness,edema, No JVD Resp:   No cough, -sputum production, -shortness of breath,-wheezing, -hemoptysis,  Other:  All other systems negative   Physical Examination:   General Appearance: No distress  EYES PERRLA, EOM intact.   NECK Supple, No JVD Pulmonary: normal breath sounds, No wheezing.  CardiovascularNormal S1,S2.  N+murmur  Abdomen: Benign, Soft, non-tender. Neurology UE/LE 5/5 strength, no focal deficits Ext pulses intact, cap refill intact ALL OTHER ROS ARE NEGATIVE      IMAGING    LONG TERM MONITOR XT (3-14 DAYS) Result Date: 05/20/2024 HR 46 to 187, average 81. Rare supraventricular and ventricular ectopy. No sustained arrhythmias. No atrial fibrillation. Ole T. Cindie, MD, [facility], [facility] Cardiac Electrophysiology       23 year old pleasant African-American female seen today for assessment of asthma assessment of OSA also found to have cardiac murmur with history of vitamin D  deficiency  Assessment & Plan Mild intermittent asthma without complication Recommend using albuterol  as needed Avoid  Allergens and Irritants Avoid secondhand smoke Avoid SICK contacts Recommend  Masking  when appropriate Recommend Keep up-to-date with vaccinations No indication for maintenance therapy at this time Stop  Airsupra  Excessive daytime sleepiness Regarding excessive daytime sleepiness and fatigue, I recommend replacing vitamin D  levels aggressively and follow-up in 3 months.  Recommend follow-up in 3 months and if patient symptoms persist recommend starting CPAP therapy for mild OSA. Murmur, cardiac Recommend echocardiogram Vitamin D  deficiency Recommend aggressive supplementation     MEDICATION ADJUSTMENTS/LABS AND TESTS ORDERED: Vitamin D  replacement therapy Albuterol  as needed Echocardiogram Follow-up cardiology Avoid Allergens and Irritants Avoid secondhand smoke Avoid SICK contacts Recommend  Masking  when appropriate Recommend Keep up-to-date with vaccinations    CURRENT MEDICATIONS REVIEWED AT LENGTH WITH PATIENT TODAY   Patient  satisfied with Plan of action and management. All questions answered   Follow up 3 months   I spent a total of 61 minutes dedicated to the care of this patient on the date of this encounter to include pre-visit review of records, face-to-face time with the patient discussing conditions above, post visit ordering of testing, clinical documentation with the electronic health record, making appropriate referrals as documented,  and communicating necessary information to the patient's healthcare team.    The Patient requires high complexity decision making for assessment and support, frequent evaluation and titration of therapies, application of advanced monitoring technologies and extensive interpretation of multiple databases.  Patient satisfied with Plan of action and management. All questions answered    Nickolas Alm Cellar, M.D.  [facility]  Medical Director [facility] Medical Director [facility]  Cardio-Pulmonary Department

## 2024-06-16 NOTE — Progress Notes (Signed)
 Full PFT completed today ? ?

## 2024-06-16 NOTE — Patient Instructions (Addendum)
 Regarding excessive daytime sleepiness and fatigue, I recommend replacing vitamin D  levels aggressively and follow-up in 3 months.  Recommend follow-up in 3 months and if patient symptoms persist recommend starting CPAP therapy for mild OSA.  Recommend using albuterol  as needed Avoid Allergens and Irritants Avoid secondhand smoke Avoid SICK contacts Recommend  Masking  when appropriate Recommend Keep up-to-date with vaccinations  With a history of murmur we will obtain echocardiogram Follow-up cardiology as scheduled

## 2024-06-16 NOTE — Progress Notes (Deleted)
 [facility]  Pulmonary Medicine Consultation      Date: 06/16/2024,   MRN# [id number] Jessica Gillespie Dec 29, 2000   CHIEF COMPLAINT:     HISTORY OF PRESENT ILLNESS     Patient is seen today for problems and issues with sleep related to excessive daytime sleepiness Patient  has been having sleep problems for many years Patient has been having excessive daytime sleepiness for a long time Patient has been having extreme fatigue and tiredness, lack of energy +  very Loud snoring every night + struggling breathe at night and gasps for air   Discussed sleep data and reviewed with patient.  Encouraged proper weight management.  Discussed driving precautions and its relationship with hypersomnolence.  Discussed operating dangerous equipment and its relationship with hypersomnolence.  Discussed sleep hygiene, and benefits of a fixed sleep waked time.  The importance of getting eight or more hours of sleep discussed with patient.  Discussed limiting the use of the computer and television before bedtime.  Decrease naps during the day, so night time sleep will become enhanced.  Limit caffeine, and sleep deprivation.  HTN, stroke, and heart failure are potential risk factors.   Discussed risk of untreated sleep apnea including cardiac arrhthymias, stroke, DM, pulm HTN.    EPWORTH SLEEP SCORE***   PAST MEDICAL HISTORY   Past Medical History:  Diagnosis Date   Asthma    Scoliosis      SURGICAL HISTORY   Past Surgical History:  Procedure Laterality Date   HERNIA REPAIR     SPINAL FUSION  2015     FAMILY HISTORY   Family History  Problem Relation Age of Onset   Hyperlipidemia Mother    Arrhythmia Mother    Obesity Mother    Hypothyroidism Father      SOCIAL HISTORY   Social History   Tobacco Use   Smoking status: Never   Smokeless tobacco: Never  Vaping Use   Vaping status: Never Used  Substance Use Topics   Alcohol use: No   Drug use: No      MEDICATIONS    Home Medication:  Current Outpatient Rx   Order #: [id number] Class: Historical Med   Order #: [id number] Class: Normal   Order #: [id number] Class: Normal   Order #: [id number] Class: Normal    Current Medication:  Current Outpatient Medications:    albuterol  (PROVENTIL  HFA;VENTOLIN  HFA) 108 (90 BASE) MCG/ACT inhaler, Inhale 2 puffs into the lungs every 6 (six) hours as needed. For coughing, Disp: , Rfl:    Albuterol -Budesonide  (AIRSUPRA ) 90-80 MCG/ACT AERO, Inhale 2 puffs into the lungs every 4 (four) hours as needed., Disp: 10.7 g, Rfl: 10   meloxicam  (MOBIC ) 7.5 MG tablet, Take 1 tablet (7.5 mg total) by mouth daily., Disp: 30 tablet, Rfl: 0   metFORMIN  (GLUCOPHAGE ) 500 MG tablet, Take 1 tablet (500 mg total) by mouth daily for 7 days, THEN 1 tablet (500 mg total) 2 (two) times daily for 7 days, THEN 2 tablets (1,000 mg total) 2 (two) times daily for 14 days. (Patient not taking: No sig reported), Disp: 60 tablet, Rfl: 5    ALLERGIES   Midazolam, Other, Penicillins, and Rocephin [ceftriaxone]   There were no vitals taken for this visit.   Review of Systems: Gen:  Denies  fever, sweats, chills weight loss  HEENT: Denies blurred vision, double vision, ear pain, eye pain, hearing loss, nose bleeds, sore throat Cardiac:  No dizziness, chest pain or heaviness, chest tightness,edema, No JVD Resp:   No  cough, -sputum production, -shortness of breath,-wheezing, -hemoptysis,  Other:  All other systems negative   Physical Examination:   General Appearance: No distress  EYES PERRLA, EOM intact.   NECK Supple, No JVD Pulmonary: normal breath sounds, No wheezing.  CardiovascularNormal S1,S2.  No m/r/g.   Abdomen: Benign, Soft, non-tender. Neurology UE/LE 5/5 strength, no focal deficits Ext pulses intact, cap refill intact ALL OTHER ROS ARE NEGATIVE      IMAGING    LONG TERM MONITOR XT (3-14 DAYS) Result Date: 05/20/2024 HR 46 to 187, average 81. Rare  supraventricular and ventricular ectopy. No sustained arrhythmias. No atrial fibrillation. Ole T. Cindie, MD, [facility], [facility] Cardiac Electrophysiology     ASSESSMENT/PLAN                MEDICATION ADJUSTMENTS/LABS AND TESTS ORDERED:    CURRENT MEDICATIONS REVIEWED AT LENGTH WITH PATIENT TODAY   Patient  satisfied with Plan of action and management. All questions answered   Follow up    I spent a total of *** minutes dedicated to the care of this patient on the date of this encounter to include pre-visit review of records, face-to-face time with the patient discussing conditions above, post visit ordering of testing, clinical documentation with the electronic health record, making appropriate referrals as documented, and communicating necessary information to the patient's healthcare team.    The Patient requires high complexity decision making for assessment and support, frequent evaluation and titration of therapies, application of advanced monitoring technologies and extensive interpretation of multiple databases.  Patient satisfied with Plan of action and management. All questions answered    Nickolas Alm Cellar, M.D.  [facility]  Medical Director [facility] Medical Director [facility] Cardio-Pulmonary Department

## 2024-06-16 NOTE — Patient Instructions (Signed)
 Full PFT completed today ? ?

## 2024-06-16 NOTE — Assessment & Plan Note (Signed)
 Recommend using albuterol  as needed Avoid Allergens and Irritants Avoid secondhand smoke Avoid SICK contacts Recommend  Masking  when appropriate Recommend Keep up-to-date with vaccinations No indication for maintenance therapy at this time Stop  Airsupra 

## 2024-06-17 LAB — HYPERSENSITIVITY PNEUMONITIS
A. Pullulans Abs: NEGATIVE
A.Fumigatus #1 Abs: NEGATIVE
Micropolyspora faeni, IgG: NEGATIVE
Pigeon Serum Abs: NEGATIVE
Thermoact. Saccharii: NEGATIVE
Thermoactinomyces vulgaris, IgG: NEGATIVE

## 2024-06-19 LAB — MISC LABCORP TEST (SEND OUT): Labcorp test code: 604567

## 2024-06-26 ENCOUNTER — Ambulatory Visit: Admitting: Family Medicine

## 2024-06-26 ENCOUNTER — Ambulatory Visit: Admitting: Certified Nurse Midwife

## 2024-06-26 VITALS — BP 115/63 | HR 94 | Resp 16 | Ht 65.0 in | Wt 172.7 lb

## 2024-06-26 DIAGNOSIS — Z30431 Encounter for routine checking of intrauterine contraceptive device: Secondary | ICD-10-CM | POA: Diagnosis not present

## 2024-06-26 DIAGNOSIS — Z124 Encounter for screening for malignant neoplasm of cervix: Secondary | ICD-10-CM

## 2024-06-26 NOTE — Progress Notes (Signed)
    GYNECOLOGY OFFICE ENCOUNTER NOTE  History:  23 y.o. G0P0000 here today for today for IUD string check; Paragard   IUD was placed  05/08/2024. No complaints about the IUD, no concerning side effects.  The following portions of the patient's history were reviewed and updated as appropriate: allergies, current medications, past family history, past medical history, past social history, past surgical history and problem list. Last pap smear on 11/01/2022 was normal, negative HRHPV.  Review of Systems:  Pertinent items are noted in HPI.  Objective:  Physical Exam Blood pressure 115/63, pulse 94, resp. rate 16, height 5' 5 (1.651 m), weight 172 lb 11.2 oz (78.3 kg). CONSTITUTIONAL: Well-developed, well-nourished female in no acute distress.  NEUROLOGIC: Alert and oriented to person, place, and time. Normal reflexes, muscle tone coordination.  ABDOMEN: Soft, no distention noted.   PELVIC: Normal appearing external genitalia; normal appearing vaginal mucosa and cervix.  IUD strings visualized, about 3 cm in length outside cervix. Done in the presence of a chaperone.  EXTREMITIES: Non-tender, no edema or cyanosis  Assessment & Plan:  Patient to keep IUD in place for up to 10 years; can come in for removal if she desires pregnancy earlier or for any concerning side effects.    Damien Parsley, CNM [facility]

## 2024-06-26 NOTE — Patient Instructions (Signed)
 Hormonal Birth Control (Hormonal Contraception): What to Know Hormonal birth control, also called hormonal contraception, is a type of birth control that uses chemicals called hormones to prevent pregnancy. It may include a combination of the hormones estrogen and progesterone, or only the hormone progesterone. Hormonal birth control works in these ways: It thickens the mucus in the cervix, which is the lowest part of the uterus. Thicker mucus makes it harder for sperm to get into the uterus. It changes the lining of the uterus. This makes it harder for an egg to attach or implant. It may stop the ovaries from releasing eggs, called ovulation. Some people who take hormonal birth control that contains only progesterone may continue to ovulate. Hormonal birth control doesn't protect against sexually transmitted infections (STIs). Pregnancy may still happen. Types of hormonal birth control  Estrogen and progesterone birth control Birth control that use a combination of estrogen and progesterone is available as: Pills that come in different combinations of hormones. Pills must be taken at the same time each day. They can affect your period. You can get your period monthly, once every 3 months, or not at all. A patch that is applied to the butt, belly, upper outer arm, or back. It's kept in place for 3 weeks. It's taken off for the last or fourth week of the menstrual cycle. A vaginal ring. The ring is placed in the vagina and left there for 3 weeks. It's then taken off for the last or fourth week of the cycle. Progesterone-only birth control Birth control that uses only progesterone is available as: Pills. These should be taken at the same time every day. This is very important to decrease the chance of pregnancy. Pills containing progestin-only are usually taken every day of the cycle. Other types of pills may have an inactive pill for the last 4 days of every cycle. Intrauterine device (IUD). This  device is inserted through the vagina and cervix into the uterus. It's taken out or replaced every 3 to 8 years, depending on the type. It can be taken out sooner. Implant. A plastic rod is placed under the skin of the upper arm. It is taken out or replaced every 3 years. It can be taken out sooner. Shot, also called injection. The shot is given once every 12 to 14 weeks. Risks associated with hormonal birth control Estrogen and progesterone birth control can sometimes cause side effects, such as: Feeling like you may throw up. Headaches. Breast tenderness. Bleeding or spotting between menstrual cycles. High blood pressure. This is rare. Strokes, heart attacks, or blood clots. These are rare. Progesterone-only birth control can sometimes have side effects, such as: Feeling like you may throw up. Headaches. Breast tenderness. Irregular menstrual bleeding. High blood pressure. This is rare. Talk to your health care provider about what side effects may mean for you. Questions to ask: What type of hormonal birth control is right for me? How long should I plan to use hormonal birth control? What are the side effects of the hormonal birth control method I choose? How can I prevent STIs while using hormonal birth control? Where to find more information Ask your provider for more information and resources about hormonal birth control. You can also go to: U.S. Department of Health and [employer], Office on Women's Health: http://hoffman.com/ This information is not intended to replace advice given to you by your health care provider. Make sure you discuss any questions you have with your health care provider. Document Revised:  04/22/2023 Document Reviewed: 04/22/2023 Elsevier Patient Education  2024 [employer].

## 2024-06-30 ENCOUNTER — Ambulatory Visit
Admission: RE | Admit: 2024-06-30 | Discharge: 2024-06-30 | Disposition: A | Discharge status: Home / Self Care | Source: Ambulatory Visit | Attending: Internal Medicine | Admitting: Internal Medicine

## 2024-06-30 ENCOUNTER — Encounter: Payer: Self-pay | Admitting: Family Medicine

## 2024-06-30 DIAGNOSIS — G4719 Other hypersomnia: Secondary | ICD-10-CM | POA: Diagnosis not present

## 2024-06-30 DIAGNOSIS — R011 Cardiac murmur, unspecified: Secondary | ICD-10-CM | POA: Diagnosis not present

## 2024-06-30 LAB — ECHOCARDIOGRAM COMPLETE
AR max vel: 2.33 cm2
AV Peak grad: 5.6 mmHg
Ao pk vel: 1.18 m/s
Area-P 1/2: 4.06 cm2
S' Lateral: 2.6 cm

## 2024-06-30 NOTE — Progress Notes (Signed)
 Echocardiogram 2D Echocardiogram has been performed.  Jessica Gillespie 06/30/2024, 5:09 PM

## 2024-07-01 LAB — MISC LABCORP TEST (SEND OUT): Labcorp test code: 7915

## 2024-07-02 ENCOUNTER — Ambulatory Visit: Payer: Self-pay | Admitting: Internal Medicine

## 2024-07-30 DIAGNOSIS — R002 Palpitations: Secondary | ICD-10-CM | POA: Insufficient documentation

## 2024-07-30 NOTE — Progress Notes (Unsigned)
 Cardiology Office Note  Date:  07/30/2024   ID:  Jessica Gillespie, DOB 2001/06/30, MRN [id number]  PCP:  Sharma Coyer, MD   No chief complaint on file.   HPI:  Jessica J Robinsonis a 23 y.o. femalewith past medical history of:  Who presents by referral from Dr. Marcine Essex for palpitations, chest tightness  Echo June 16, 2024 Normal study  PMH:   has a past medical history of Asthma and Scoliosis.  PSH:    Past Surgical History:  Procedure Laterality Date   HERNIA REPAIR     SPINAL FUSION  2015    Current Outpatient Medications  Medication Sig Dispense Refill   albuterol  (VENTOLIN  HFA) 108 (90 Base) MCG/ACT inhaler Inhale 2 puffs into the lungs every 4 (four) hours as needed. For coughing 6.7 g 10   meloxicam  (MOBIC ) 7.5 MG tablet Take 1 tablet (7.5 mg total) by mouth daily. 30 tablet 0   metFORMIN  (GLUCOPHAGE ) 500 MG tablet Take 1 tablet (500 mg total) by mouth daily for 7 days, THEN 1 tablet (500 mg total) 2 (two) times daily for 7 days, THEN 2 tablets (1,000 mg total) 2 (two) times daily for 14 days. (Patient not taking: No sig reported) 60 tablet 5   No current facility-administered medications for this visit.     Allergies:   Midazolam, Other, Penicillins, and Rocephin [ceftriaxone]   Social History:  The patient  reports that she has never smoked. She has never used smokeless tobacco. She reports that she does not drink alcohol and does not use drugs.   Family History:   family history includes Arrhythmia in her mother; Hyperlipidemia in her mother; Hypothyroidism in her father; Obesity in her mother.    Review of Systems: ROS   PHYSICAL EXAM: VS:  There were no vitals taken for this visit. , BMI There is no height or weight on file to calculate BMI. GEN: Well nourished, well developed, in no acute distress HEENT: normal Neck: no JVD, carotid bruits, or masses Cardiac: RRR; no murmurs, rubs, or gallops,no edema  Respiratory:  clear to  auscultation bilaterally, normal work of breathing GI: soft, nontender, nondistended, + BS MS: no deformity or atrophy Skin: warm and dry, no rash Neuro:  Strength and sensation are intact Psych: euthymic mood, full affect    Recent Labs: 03/13/2024: ALT 11; BUN 7; Creatinine, Ser 0.96; Potassium 3.9; Sodium 143; TSH 0.841 06/12/2024: Hemoglobin 13.2; Platelets 300    Lipid Panel Lab Results  Component Value Date   CHOL 190 04/17/2024   HDL 52 04/17/2024   LDLCALC 119 (H) 04/17/2024   TRIG 105 04/17/2024      Wt Readings from Last 3 Encounters:  06/26/24 172 lb 11.2 oz (78.3 kg)  06/16/24 164 lb (74.4 kg)  06/16/24 164 lb (74.4 kg)       ASSESSMENT AND PLAN:  Problem List Items Addressed This Visit   None    Disposition:   F/U  12 months   Total encounter time more than 30 minutes  Greater than 50% was spent in counseling and coordination of care with the patient    Signed, Velinda Lunger, M.D., Ph.D. [facility] [place], [place] [phone]

## 2024-07-31 ENCOUNTER — Ambulatory Visit: Attending: Cardiovascular Disease | Admitting: Cardiovascular Disease

## 2024-07-31 ENCOUNTER — Encounter: Payer: Self-pay | Admitting: Cardiovascular Disease

## 2024-07-31 VITALS — BP 100/60 | HR 57 | Ht 65.0 in | Wt 175.2 lb

## 2024-07-31 DIAGNOSIS — R002 Palpitations: Secondary | ICD-10-CM

## 2024-07-31 DIAGNOSIS — R0789 Other chest pain: Secondary | ICD-10-CM

## 2024-07-31 DIAGNOSIS — R001 Bradycardia, unspecified: Secondary | ICD-10-CM

## 2024-07-31 NOTE — Patient Instructions (Addendum)

## 2024-07-31 NOTE — Progress Notes (Signed)
 This encounter was created in error - please disregard.

## 2024-08-21 ENCOUNTER — Encounter: Payer: Self-pay | Admitting: Family Medicine

## 2024-08-21 ENCOUNTER — Telehealth (INDEPENDENT_AMBULATORY_CARE_PROVIDER_SITE_OTHER): Admitting: Family Medicine

## 2024-08-21 ENCOUNTER — Other Ambulatory Visit: Payer: Self-pay

## 2024-08-21 DIAGNOSIS — R4589 Other symptoms and signs involving emotional state: Secondary | ICD-10-CM | POA: Diagnosis not present

## 2024-08-21 DIAGNOSIS — R41840 Attention and concentration deficit: Secondary | ICD-10-CM | POA: Diagnosis not present

## 2024-08-21 MED ORDER — BUPROPION HCL ER (XL) 150 MG PO TB24
150.0000 mg | ORAL_TABLET | Freq: Every day | ORAL | 3 refills | Status: AC
  Filled 2024-08-21: qty 60, 60d supply, fill #0
  Filled 2024-10-23: qty 60, 60d supply, fill #1

## 2024-08-21 NOTE — Patient Instructions (Signed)
 To keep you healthy, please keep in mind the following health maintenance items that you are due for:   There are no preventive care reminders to display for this patient.   Best Wishes,   Dr. Lang

## 2024-08-21 NOTE — Progress Notes (Signed)
 MyChart Video Visit    Virtual Visit via Video Note   This format is felt to be most appropriate for this patient at this time. Physical exam was limited by quality of the video and audio technology used for the visit.   Patient location: Patient's home address   Provider location: [facility]  [address], [address]  [place], [place] [address]   I discussed the limitations of evaluation and management by telemedicine and the availability of in person appointments. The patient expressed understanding and agreed to proceed.  Patient: Jessica Gillespie   DOB: 06/25/2001   23 y.o. Female  MRN: [id number] Visit Date: 08/21/2024  Today's healthcare provider: Rockie Agent, MD   Chief Complaint  Patient presents with   attention concern    Subjective    HPI   Discussed the use of AI scribe software for clinical note transcription with the patient, who gave verbal consent to proceed.  History of Present Illness Jessica Gillespie is a 23 year old female who presents for ADHD screening and consideration of restarting Wellbutrin.  She experiences difficulty focusing, particularly during school and work, which has intensified over the past semester. She describes feeling intensely bored and restless, affecting her academic performance. Previously, she was able to concentrate well but now struggles to maintain focus for extended periods. Additionally, she feels cranky and not her usual self, attributing these changes to possible depression or a 'quarter life crisis.'  She has a history of using Wellbutrin, which she found beneficial for focus and mood stabilization. She is interested in restarting this medication to address her current symptoms.  Her past medical history includes polycystic ovary syndrome (PCOS), palpitations, and scoliosis. She was evaluated by cardiology, where an echocardiogram showed normal results, and she was found to have rare premature  atrial contractions with minimal symptoms. No further cardiac treatment was recommended due to her low symptom burden and a heart rate of 57 bpm at the time.  She is currently using albuterol  for asthma management and follows up with her pulmonologist. She experiences excessive daytime sleepiness.      Past Medical History:  Diagnosis Date   Asthma    Scoliosis    Adult ADHD Self Report Scale (most recent)     Adult ADHD Self-Report Scale (ASRS-v1.1) Symptom Checklist - 08/21/24 1556       Part A   1. How often do you have trouble wrapping up the final details of a project, once the challenging parts have been done? Rarely  2. How often do you have difficulty getting things done in order when you have to do a task that requires organization? Rarely    3. How often do you have problems remembering appointments or obligations? Often  4. When you have a task that requires a lot of thought, how often do you avoid or delay getting started? Often    5. How often do you fidget or squirm with your hands or feet when you have to sit down for a long time? Very Often  6. How often do you feel overly active and compelled to do things, like you were driven by a motor? Rarely      Part B   7. How often do you make careless mistakes when you have to work on a boring or difficult project? Sometimes  8. How often do you have difficulty keeping your attention when you are doing boring or repetitive work? Very Often  9. How often do you have difficulty concentrating on what people say to you, even when they are speaking to you directly? Rarely  10. How often do you misplace or have difficulty finding things at home or at work? Sometimes    11. How often are you distracted by activity or noise around you? Often  12. How often do you leave your seat in meetings or other situations in which you are expected to remain seated? Rarely    13. How often do you feel restless or fidgety? Sometimes  14. How often do  you have difficulty unwinding and relaxing when you have time to yourself? Rarely    15. How often do you find yourself talking too much when you are in social situations? Rarely  16. When you are in a conversation, how often do you find yourself finishing the sentences of the people you are talking to, before they can finish them themselves? Sometimes    17. How often do you have difficulty waiting your turn in situations when turn taking is required? Rarely  18. How often do you interrupt others when they are busy? Rarely      Comment   How old were you when these problems first began to occur? 14                08/21/2024    4:05 PM 04/17/2024    3:08 PM 03/13/2024    2:46 PM  PHQ9 SCORE ONLY  PHQ-9 Total Score 1 10  0      Data saved with a previous flowsheet row definition       04/17/2024    3:08 PM  GAD 7 : Generalized Anxiety Score  Nervous, Anxious, on Edge 0  Control/stop worrying 0  Worry too much - different things 1  Trouble relaxing 0  Restless 0  Easily annoyed or irritable 1  Afraid - awful might happen 0  Total GAD 7 Score 2  Anxiety Difficulty Not difficult at all     Medications: Outpatient Medications Prior to Visit  Medication Sig   albuterol  (VENTOLIN  HFA) 108 (90 Base) MCG/ACT inhaler Inhale 2 puffs into the lungs every 4 (four) hours as needed. For coughing   No facility-administered medications prior to visit.    Review of Systems      Objective    There were no vitals taken for this visit.      Physical Exam   Physical Exam Vitals reviewed.  Constitutional:      General: She is not in acute distress.    Appearance: Normal appearance. She is not ill-appearing.  Pulmonary:     Effort: Pulmonary effort is normal. No respiratory distress.  Neurological:     Mental Status: She is alert and oriented to person, place, and time.  Psychiatric:        Mood and Affect: Mood normal.        Behavior: Behavior normal.        Thought  Content: Thought content normal.       Assessment & Plan     Problem List Items Addressed This Visit   None Visit Diagnoses       Attention deficit    -  Primary   Relevant Medications   buPROPion (WELLBUTRIN XL) 150 MG 24 hr tablet     Depressed mood       Relevant Medications   buPROPion (WELLBUTRIN XL) 150 MG 24 hr tablet  Assessment and Plan Assessment & Plan Attention-deficit disorder, predominantly inattentive type Chronic symptoms  Reports difficulty focusing, restlessness, and intense boredom, impacting school and work performance. Symptoms have been present since childhood, with increased severity recently. Screening indicates symptoms that may be consistent with ADHD, predominantly inattentive type. Previously benefited from Wellbutrin, which improved focus and mood. - Prescribed Wellbutrin 150 mg once daily. - Will schedule follow-up in six weeks to assess response to medication. - Will consider referral for formal ADHD evaluation if symptoms persist.  Depressive symptoms Chronic  Reports feeling down and cranky, with decreased interest in activities over the past two weeks. No feelings of hopelessness. Wellbutrin previously helped with mood, and restarting it may alleviate depressive symptoms. - Prescribed Wellbutrin 150 mg once daily. - Will schedule follow-up in six weeks to assess mood improvement.     Return in about 2 months (around 10/21/2024) for attention deficit .     I discussed the assessment and treatment plan with the patient. The patient was provided an opportunity to ask questions and all were answered. The patient agreed with the plan and demonstrated an understanding of the instructions.   The patient was advised to call back or seek an in-person evaluation if the symptoms worsen or if the condition fails to improve as anticipated.  I provided 20 minutes of non-face-to-face time during this encounter.   Rockie Agent,  MD [facility] [phone] (phone) [phone] (fax)  [facility]

## 2024-08-24 ENCOUNTER — Encounter: Payer: Self-pay | Admitting: Radiology

## 2024-09-24 ENCOUNTER — Other Ambulatory Visit: Payer: Self-pay

## 2024-09-24 ENCOUNTER — Other Ambulatory Visit: Payer: Self-pay | Admitting: Internal Medicine

## 2024-09-24 ENCOUNTER — Ambulatory Visit: Admitting: Internal Medicine

## 2024-09-24 DIAGNOSIS — J4520 Mild intermittent asthma, uncomplicated: Secondary | ICD-10-CM

## 2024-09-24 MED ORDER — ALBUTEROL SULFATE (2.5 MG/3ML) 0.083% IN NEBU
2.5000 mg | INHALATION_SOLUTION | RESPIRATORY_TRACT | 8 refills | Status: AC | PRN
  Filled 2024-09-24: qty 75, 5d supply, fill #0

## 2024-09-24 NOTE — Progress Notes (Signed)
 ALB nebs as needed

## 2024-10-04 ENCOUNTER — Other Ambulatory Visit: Payer: Self-pay

## 2024-10-19 ENCOUNTER — Encounter: Payer: Self-pay | Admitting: Family Medicine

## 2024-11-07 ENCOUNTER — Ambulatory Visit
Admission: EM | Admit: 2024-11-07 | Discharge: 2024-11-07 | Disposition: A | Discharge status: Home / Self Care | Attending: Emergency Medicine | Admitting: Emergency Medicine

## 2024-11-07 ENCOUNTER — Encounter: Payer: Self-pay | Admitting: Emergency Medicine

## 2024-11-07 ENCOUNTER — Other Ambulatory Visit: Payer: Self-pay

## 2024-11-07 DIAGNOSIS — J029 Acute pharyngitis, unspecified: Secondary | ICD-10-CM

## 2024-11-07 DIAGNOSIS — J069 Acute upper respiratory infection, unspecified: Secondary | ICD-10-CM | POA: Diagnosis not present

## 2024-11-07 LAB — POCT RAPID STREP A (OFFICE): Rapid Strep A Screen: NEGATIVE

## 2024-11-07 MED ORDER — AZITHROMYCIN 250 MG PO TABS
ORAL_TABLET | ORAL | 0 refills | Status: AC
  Filled 2024-11-07: qty 6, 5d supply, fill #0

## 2024-11-07 NOTE — ED Triage Notes (Signed)
 Patient reports sore throat, swollen tonsils and post nasal drip x 1 week. Patient has taken nasal spray with mild relief. Rates pain 3/10.

## 2024-11-07 NOTE — Discharge Instructions (Addendum)
 Rapid strep test is negative for bacteria  As your symptoms have persisted for 7 days without signs of resolve we will provide coverage for bacterial  Take azithromycin  as directed    You can take Tylenol  and/or Ibuprofen  as needed for fever reduction and pain relief.   For cough: honey 1/2 to 1 teaspoon (you can dilute the honey in water or another fluid).  You can also use guaifenesin and dextromethorphan for cough. You can use a humidifier for chest congestion and cough.  If you don't have a humidifier, you can sit in the bathroom with the hot shower running.      For sore throat: try warm salt water gargles, cepacol lozenges, throat spray, warm tea or water with lemon/honey, popsicles or ice, or OTC cold relief medicine for throat discomfort.   For congestion: take a daily anti-histamine like Zyrtec, Claritin, and a oral decongestant, such as pseudoephedrine.  You can also use Flonase 1-2 sprays in each nostril daily.   It is important to stay hydrated: drink plenty of fluids (water, gatorade/powerade/pedialyte, juices, or teas) to keep your throat moisturized and help further relieve irritation/discomfort.

## 2024-11-07 NOTE — ED Provider Notes (Signed)
 " [facility]    CSN: [id number] Arrival date & time: 11/07/24  1224      History   Chief Complaint Chief Complaint  Patient presents with   Sore Throat   Nasal Congestion    HPI Rocquel ETSUKO DIEROLF is a 24 y.o. female.   Patient presents for evaluation of a fever peaking at 100.4, nasal congestion, bilateral ear pain and fullness, postnasal drip, sore throat and nausea without vomiting present for 7 days.  Ear pain and fullness has resolved.  Feels as if the tonsils have been swollen.  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  Decreased appetite but tolerable to some food and liquids.  Has attempted use of nasal spray, vitamin D , elderberry, ginger, denies cough.  Past Medical History:  Diagnosis Date   Asthma    Scoliosis     Patient Active Problem List   Diagnosis Date Noted   Palpitations 07/30/2024   Hx of scoliosis 04/17/2024   Well woman exam with routine gynecological exam 03/13/2024   PCOS (polycystic ovarian syndrome) 03/13/2024   Annual physical exam 11/01/2022   BMI 27.0-27.9,adult 11/01/2022   Asthma 11/01/2022   History of spinal fusion for scoliosis 11/01/2022   Need for HPV vaccination 11/01/2022   Idiopathic scoliosis and kyphoscoliosis 04/11/2011    Past Surgical History:  Procedure Laterality Date   HERNIA REPAIR     SPINAL FUSION  2015    OB History     Gravida  0   Para  0   Term  0   Preterm  0   AB  0   Living  0      SAB  0   IAB  0   Ectopic  0   Multiple  0   Live Births  0            Home Medications    Prior to Admission medications  Medication Sig Start Date End Date Taking? Authorizing Provider  albuterol  (PROVENTIL ) (2.5 MG/3ML) 0.083% nebulizer solution Take 3 mLs (2.5 mg total) by nebulization every 4 (four) hours as needed for wheezing or shortness of breath. 09/24/24 09/24/25  Kasa, Kurian, MD  albuterol  (VENTOLIN  HFA) 108 (90 Base) MCG/ACT inhaler Inhale 2 puffs into  the lungs every 4 (four) hours as needed. For coughing 06/16/24   Kasa, Kurian, MD  buPROPion  (WELLBUTRIN  XL) 150 MG 24 hr tablet Take 1 tablet (150 mg total) by mouth daily. 08/21/24   Simmons-Lye, Rockie, MD    Family History Family History  Problem Relation Age of Onset   Hyperlipidemia Mother    Arrhythmia Mother    Obesity Mother    Hypothyroidism Father     Social History Social History[1]   Allergies   Midazolam, Other, Penicillins, and Rocephin [ceftriaxone]   Review of Systems Review of Systems  Constitutional:  Positive for fever. Negative for activity change, appetite change, chills, diaphoresis, fatigue and unexpected weight change.  HENT:  Positive for congestion, ear pain, postnasal drip and sore throat. Negative for dental problem, drooling, ear discharge, facial swelling, hearing loss, mouth sores, nosebleeds, rhinorrhea, sinus pressure, sinus pain, sneezing, tinnitus, trouble swallowing and voice change.   Respiratory: Negative.    Cardiovascular: Negative.   Gastrointestinal:  Positive for nausea. Negative for abdominal distention, abdominal pain, anal bleeding, blood in stool, constipation, diarrhea, rectal pain and vomiting.     Physical Exam Triage Vital Signs ED Triage Vitals  Encounter Vitals Group  BP 11/07/24 1349 121/76     Girls Systolic BP Percentile --      Girls Diastolic BP Percentile --      Boys Systolic BP Percentile --      Boys Diastolic BP Percentile --      Pulse Rate 11/07/24 1349 72     Resp 11/07/24 1349 19     Temp 11/07/24 1349 98.6 F (37 C)     Temp Source 11/07/24 1349 Oral     SpO2 11/07/24 1349 97 %     Weight --      Height --      Head Circumference --      Peak Flow --      Pain Score 11/07/24 1351 3     Pain Loc --      Pain Education --      Exclude from Growth Chart --    No data found.  Updated Vital Signs BP 121/76 (BP Location: Right Arm)   Pulse 72   Temp 98.6 F (37 C) (Oral)   Resp 19    LMP 10/24/2024 (Exact Date)   SpO2 97%   Visual Acuity Right Eye Distance:   Left Eye Distance:   Bilateral Distance:    Right Eye Near:   Left Eye Near:    Bilateral Near:     Physical Exam Constitutional:      Appearance: She is well-developed.  HENT:     Right Ear: Tympanic membrane and ear canal normal.     Left Ear: Tympanic membrane and ear canal normal.     Nose: Congestion present.     Mouth/Throat:     Pharynx: Posterior oropharyngeal erythema present. No oropharyngeal exudate.     Tonsils: No tonsillar exudate.  Cardiovascular:     Rate and Rhythm: Normal rate and regular rhythm.     Heart sounds: No murmur heard. Musculoskeletal:     Cervical back: Normal range of motion and neck supple.  Lymphadenopathy:     Cervical: Cervical adenopathy present.  Neurological:     General: No focal deficit present.     Mental Status: She is alert and oriented to person, place, and time.      UC Treatments / Results  Labs (all labs ordered are listed, but only abnormal results are displayed) Labs Reviewed  POCT RAPID STREP A (OFFICE)    EKG   Radiology No results found.  Procedures Procedures (including critical care time)  Medications Ordered in UC Medications - No data to display  Initial Impression / Assessment and Plan / UC Course  I have reviewed the triage vital signs and the nursing notes.  Pertinent labs & imaging results that were available during my care of the patient were reviewed by me and considered in my medical decision making (see chart for details).  Acute URI, sore throat  Patient is in no signs of distress nor toxic appearing.  Vital signs are stable.  Low suspicion for pneumonia, pneumothorax or bronchitis and therefore will defer imaging.  Rapid strep test negative, discussed findings with patient.  Symptoms persisting for 7 days empirically placed on azithromycin .May use additional over-the-counter medications as needed for supportive  care.  May follow-up with urgent care as needed if symptoms persist or worsen.   Final Clinical Impressions(s) / UC Diagnoses   Final diagnoses:  Sore throat   Discharge Instructions   None    ED Prescriptions   None    PDMP not reviewed this  encounter.     [1]  Social History Tobacco Use   Smoking status: Never   Smokeless tobacco: Never  Vaping Use   Vaping status: Never Used  Substance Use Topics   Alcohol use: No   Drug use: No     Teresa Shelba SAUNDERS, NP 11/07/24 1418  "

## 2025-01-08 ENCOUNTER — Ambulatory Visit: Admitting: Family Medicine

## 2025-02-19 ENCOUNTER — Ambulatory Visit: Admitting: Family Medicine

## 2025-02-19 ENCOUNTER — Encounter: Admitting: Family Medicine
